# Patient Record
Sex: Male | Born: 1990 | Race: White | Hispanic: No | Marital: Single | Smoking: Current every day smoker
Health system: Southern US, Community
[De-identification: ages and names within clinical notes are randomized; demographics above are authoritative.]

## PROBLEM LIST (undated history)

## (undated) DIAGNOSIS — T7840XA Allergy, unspecified, initial encounter: Secondary | ICD-10-CM

## (undated) HISTORY — DX: Allergy, unspecified, initial encounter: T78.40XA

---
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
## 2010-05-02 ENCOUNTER — Emergency Department (HOSPITAL_COMMUNITY): Admission: EM | Admit: 2010-05-02 | Discharge: 2010-05-02 | Payer: Self-pay | Admitting: Emergency Medicine

## 2015-02-11 ENCOUNTER — Ambulatory Visit (INDEPENDENT_AMBULATORY_CARE_PROVIDER_SITE_OTHER): Payer: Self-pay | Admitting: Family Medicine

## 2015-02-11 VITALS — BP 122/42 | HR 57 | Temp 97.6°F | Resp 17 | Ht 74.5 in | Wt 272.2 lb

## 2015-02-11 DIAGNOSIS — R590 Localized enlarged lymph nodes: Secondary | ICD-10-CM

## 2015-02-11 DIAGNOSIS — K1120 Sialoadenitis, unspecified: Secondary | ICD-10-CM

## 2015-02-11 MED ORDER — CLINDAMYCIN HCL 300 MG PO CAPS: 300.0000 mg | ORAL_CAPSULE | Freq: Three times a day (TID) | ORAL | Status: DC

## 2015-02-11 MED ORDER — CLINDAMYCIN HCL 150 MG PO CAPS: 300.0000 mg | ORAL_CAPSULE | Freq: Three times a day (TID) | ORAL | Status: AC

## 2015-02-11 NOTE — Progress Notes (Signed)
Subjective: 24 year old who has not been here for a long time. He has had a 5 day history of swelling on the right side of his neck. He has a little infected pustule in his beard on the right cheek. He has no fever. It came up and swelled down his neck further. He took a couple of old amoxicillin and it is gone down some but he was concerned about the amount of swelling and discomfort in the side of his neck.  Objective: His TMs are normal. Throat clear. No palpable tenderness over the duct in the right cheek. His neck has enlargement of the right parotid versus a large node at the same area. A couple small nodes below that. His chest is clear. He has a little staph abscess in his beard which is drained.  Assessment: Parotitis versus lymphadenitis  Plan: Clindamycin 300 mg 3 times a day Return if worse

## 2015-02-11 NOTE — Patient Instructions (Signed)
Drink plenty of fluid  Suck on slivers of lemon several times daily and see if it helps the gland to go down.  Take clindamycin 3 times daily  Return if worse

## 2015-02-11 NOTE — Addendum Note (Signed)
Addended byLevon Hedger: CLINE, REBEKAH A on: 02/11/2015 06:40 PM   Modules accepted: Orders, Medications

## 2015-02-27 ENCOUNTER — Telehealth: Payer: Self-pay

## 2015-02-27 NOTE — Telephone Encounter (Signed)
Spoke with pt, he states he noticed his lymph nodes not getting better and feels he needs a refill on his Cleocin. He doesn't feel bad but is scared of his lymph nodes swelling up again and feels if he has one more round of ABX he will be ok. I advised pt to RTC, he states he does not have any insurance and does not have any money for an OV. Please advise.

## 2015-02-27 NOTE — Telephone Encounter (Signed)
Pt was seen for staph infection on face, was treated with clindamycin (CLEOCIN) 150 MG capsule   Requesting 60 count 150 MG  [facility] [address]   [phone]

## 2015-03-01 NOTE — Telephone Encounter (Signed)
Patient calling to check the [facility] [address] Per patient the area is getting worst and he is concerned. Patient requesting someone to please call him back to let him know if we are going to be able to call this in cb# [phone]

## 2015-03-03 NOTE — Telephone Encounter (Signed)
Call patient: He needs to be rechecked if the node is still swelling.

## 2015-03-03 NOTE — Telephone Encounter (Signed)
lmom for pt to cb

## 2015-03-04 NOTE — Telephone Encounter (Signed)
Left message for pt to call back  °

## 2015-03-10 NOTE — Telephone Encounter (Signed)
lmom for pt to rtc if issue is continung

## 2016-06-28 ENCOUNTER — Encounter (HOSPITAL_COMMUNITY): Payer: Self-pay | Admitting: Emergency Medicine

## 2016-06-28 ENCOUNTER — Emergency Department (HOSPITAL_COMMUNITY)
Admission: EM | Admit: 2016-06-28 | Discharge: 2016-06-28 | Disposition: A | Discharge status: Home / Self Care | Payer: Self-pay | Attending: Emergency Medicine | Admitting: Emergency Medicine

## 2016-06-28 DIAGNOSIS — F17210 Nicotine dependence, cigarettes, uncomplicated: Secondary | ICD-10-CM | POA: Insufficient documentation

## 2016-06-28 DIAGNOSIS — W540XXA Bitten by dog, initial encounter: Secondary | ICD-10-CM | POA: Insufficient documentation

## 2016-06-28 DIAGNOSIS — Y939 Activity, unspecified: Secondary | ICD-10-CM | POA: Insufficient documentation

## 2016-06-28 DIAGNOSIS — Y999 Unspecified external cause status: Secondary | ICD-10-CM | POA: Insufficient documentation

## 2016-06-28 DIAGNOSIS — Z79899 Other long term (current) drug therapy: Secondary | ICD-10-CM | POA: Insufficient documentation

## 2016-06-28 DIAGNOSIS — Y929 Unspecified place or not applicable: Secondary | ICD-10-CM | POA: Insufficient documentation

## 2016-06-28 DIAGNOSIS — S41151A Open bite of right upper arm, initial encounter: Secondary | ICD-10-CM

## 2016-06-28 DIAGNOSIS — S61551A Open bite of right wrist, initial encounter: Secondary | ICD-10-CM | POA: Insufficient documentation

## 2016-06-28 DIAGNOSIS — S50811A Abrasion of right forearm, initial encounter: Secondary | ICD-10-CM | POA: Insufficient documentation

## 2016-06-28 MED ORDER — AMOXICILLIN-POT CLAVULANATE 875-125 MG PO TABS: 1.0000 | ORAL_TABLET | Freq: Two times a day (BID) | ORAL | Status: DC

## 2016-06-28 NOTE — Discharge Instructions (Signed)

## 2016-06-28 NOTE — ED Provider Notes (Signed)
CSN: [id number]     Arrival date & time 06/28/16  1233 History   First MD Initiated Contact with Patient 06/28/16 1620     Chief Complaint  Patient presents with  . Animal Bite     (Consider location/radiation/quality/duration/timing/severity/associated sxs/prior Treatment) HPI   Sit 25 year old male who presents emergency Department with chief complaint of dog bite to the right wrist. This occurred just prior to arrival in the ED. the patient is up-to-date on his last tetanus vaccination. He cleaned the wounds thoroughly after the bite. He denies numbness or tingling in his fingers, he denies weakness or inability to move the wrist, hand or fingers. The dog is his own dog and is up-to-date on its vaccinations  Past Medical History  Diagnosis Date  . Allergy    History reviewed. No pertinent past surgical history. Family History  Problem Relation Age of Onset  . Stroke Mother   . Diabetes Maternal Grandmother   . Diabetes Maternal Grandfather   . Diabetes Paternal Grandmother    Social History  Substance Use Topics  . Smoking status: Current Every Day Smoker -- 0.50 packs/day    Types: Cigarettes  . Smokeless tobacco: None  . Alcohol Use: No    Review of Systems  Ten systems reviewed and are negative for acute change, except as noted in the HPI.    Allergies  Sulfa antibiotics  Home Medications   Prior to Admission medications   Medication Sig Start Date End Date Taking? Authorizing Provider  amoxicillin-clavulanate (AUGMENTIN) 875-125 MG tablet Take 1 tablet by mouth 2 (two) times daily. One po bid x 7 days 06/28/16   Arthor Captain, PA-C  clindamycin (CLEOCIN) 150 MG capsule Take 2 capsules (300 mg total) by mouth 3 (three) times daily. 02/11/15   Peyton Najjar, MD   BP 132/69 mmHg  Pulse 64  Temp(Src) 99.5 F (37.5 C) (Oral)  Resp 18  Ht  (1.854 m)  Wt 102.059 kg  BMI 29.69 kg/m2  SpO2 100% Physical Exam  Constitutional: He appears well-developed and  well-nourished. No distress.  HENT:  Head: Normocephalic and atraumatic.  Eyes: Conjunctivae are normal. No scleral icterus.  Neck: Normal range of motion. Neck supple.  Cardiovascular: Normal rate, regular rhythm and normal heart sounds.   Pulmonary/Chest: Effort normal and breath sounds normal. No respiratory distress.  Abdominal: Soft. There is no tenderness.  Musculoskeletal: He exhibits no edema.  Multiple small abrasions to the right forearm, 1 cm laceration over the palmar wrist surface, bleeding well controlled, 1/2 cm laceration over the ulnar wrist surface. Full range of motion of the wrist and fingers, strong grip strength, pulses intact, no sign of infection or tendon injury  Neurological: He is alert.  Skin: Skin is warm and dry. He is not diaphoretic.  Psychiatric: His behavior is normal.  Nursing note and vitals reviewed.   ED Course  Procedures (including critical care time) Labs Review Labs Reviewed - No data to display  Imaging Review No results found. I have personally reviewed and evaluated these images and lab results as part of my medical decision-making.   EKG Interpretation None      MDM   Final diagnoses:  Dog bite of arm, right, initial encounter    Patient with Dr. bite to the wrist, up-to-date on his tetanus vaccination, wounds cleansed and dressed in the emergency department. Patient is to start on Augmentin. Follow-up with hand, discussed return precautions to the ED. Pierce safe for discharge at this  time    Arthor Captainbigail Cranford Blessinger, PA-C 06/29/16 0152  Benjiman CoreNathan Pickering, MD 06/30/16 [phone]

## 2016-06-28 NOTE — ED Notes (Signed)
Declined W/C at D/C and was escorted to lobby by RN. 

## 2016-06-28 NOTE — ED Notes (Signed)
Pt here with a dog bite to the right wrist , bleeding controlled  Dogs  Shots are up to date

## 2016-08-20 ENCOUNTER — Emergency Department (HOSPITAL_COMMUNITY)
Admission: EM | Admit: 2016-08-20 | Discharge: 2016-08-21 | Disposition: A | Discharge status: Home / Self Care | Payer: Self-pay | Attending: Emergency Medicine | Admitting: Emergency Medicine

## 2016-08-20 ENCOUNTER — Encounter (HOSPITAL_COMMUNITY): Payer: Self-pay | Admitting: Emergency Medicine

## 2016-08-20 DIAGNOSIS — F17210 Nicotine dependence, cigarettes, uncomplicated: Secondary | ICD-10-CM | POA: Insufficient documentation

## 2016-08-20 DIAGNOSIS — Y999 Unspecified external cause status: Secondary | ICD-10-CM | POA: Insufficient documentation

## 2016-08-20 DIAGNOSIS — W540XXA Bitten by dog, initial encounter: Secondary | ICD-10-CM | POA: Insufficient documentation

## 2016-08-20 DIAGNOSIS — Y929 Unspecified place or not applicable: Secondary | ICD-10-CM | POA: Insufficient documentation

## 2016-08-20 DIAGNOSIS — Y939 Activity, unspecified: Secondary | ICD-10-CM | POA: Insufficient documentation

## 2016-08-20 DIAGNOSIS — S51851A Open bite of right forearm, initial encounter: Secondary | ICD-10-CM | POA: Insufficient documentation

## 2016-08-21 MED ORDER — AMOXICILLIN-POT CLAVULANATE 875-125 MG PO TABS: 1.0000 | ORAL_TABLET | Freq: Two times a day (BID) | ORAL | 0 refills | Status: AC

## 2016-08-21 NOTE — Discharge Instructions (Signed)
It was my pleasure taking care of you today!  Please take all of your antibiotics until finished! Keep the area clean and dry. Monitor for signs of infection including fever, redness, worsening swelling or drainage from the site. Return to ER for these signs.

## 2016-08-21 NOTE — ED Provider Notes (Signed)
[facility]-EMERGENCY DEPT Provider Note   CSN: [id number] Arrival date & time: 08/20/16  2349     History   Chief Complaint Chief Complaint  Patient presents with  . Animal Bite    HPI Joe Bradshaw is a 25 y.o. male.  The history is provided by the patient and medical records. No language interpreter was used.  Animal Bite  Associated symptoms: no numbness    Joe Bradshaw is a 25 y.o. male  who presents to the Emergency Department complaining of dog bite to the right forearm that occurred tonight just prior to arrival. Dog was his own pet and he was breaking up a fight between he and his brothers dog. Both dogs involved are fully vaccinated. Patient cleaned wound with rubbing alcohol prior to arrival. No medications taken for pain. No other associated symptoms.    Past Medical History:  Diagnosis Date  . Allergy     There are no active problems to display for this patient.   History reviewed. No pertinent surgical history.     Home Medications    Prior to Admission medications   Medication Sig Start Date End Date Taking? Authorizing Provider  amoxicillin-clavulanate (AUGMENTIN) 875-125 MG tablet Take 1 tablet by mouth 2 (two) times daily. One po bid x 7 days 06/28/16   Arthor Captain, PA-C  clindamycin (CLEOCIN) 150 MG capsule Take 2 capsules (300 mg total) by mouth 3 (three) times daily. 02/11/15   Peyton Najjar, MD    Family History Family History  Problem Relation Age of Onset  . Stroke Mother   . Diabetes Maternal Grandmother   . Diabetes Maternal Grandfather   . Diabetes Paternal Grandmother     Social History Social History  Substance Use Topics  . Smoking status: Current Every Day Smoker    Packs/day: 0.50    Types: Cigarettes  . Smokeless tobacco: Never Used  . Alcohol use No     Allergies   Sulfa antibiotics   Review of Systems Review of Systems  Skin: Positive for wound.  Neurological: Negative for numbness.     Physical  Exam Updated Vital Signs BP 118/95 (BP Location: Left Arm)   Pulse 72   Temp 99 F (37.2 C) (Oral)   Resp 18   Ht 6' 1.5" (1.867 m)   Wt 96.6 kg   SpO2 99%   BMI 27.72 kg/m   Physical Exam  Constitutional: He is oriented to person, place, and time. He appears well-developed and well-nourished. No distress.  HENT:  Head: Normocephalic and atraumatic.  Cardiovascular: Normal rate, regular rhythm, normal heart sounds and intact distal pulses.   Pulmonary/Chest: Effort normal and breath sounds normal. No respiratory distress.  Abdominal: Soft. He exhibits no distension. There is no tenderness.  Musculoskeletal:  RUE with full ROM.   Neurological: He is alert and oriented to person, place, and time.  RUE is neurovascularly intact.   Skin: Skin is warm and dry.  See images below.  Two superficial wounds on dorsal aspect of forearm. Palmar aspect with wound approx. 0.5 cm in depth.   Nursing note and vitals reviewed.        ED Treatments / Results  Labs (all labs ordered are listed, but only abnormal results are displayed) Labs Reviewed - No data to display  EKG  EKG Interpretation None       Radiology No results found.  Procedures Procedures (including critical care time)  Medications Ordered in ED Medications - No data  to display   Initial Impression / Assessment and Plan / ED Course  I have reviewed the triage vital signs and the nursing notes.  Pertinent labs & imaging results that were available during my care of the patient were reviewed by me and considered in my medical decision making (see chart for details).  Clinical Course   Joe Bradshaw is a 25 y.o. male who presents to ED for dog bite on right forearm that occurred just prior to arrival. Dog is his own pet and immunizations are up-to-date. Wound thoroughly cleaned in ED. Home care instructions discussed. Return precautions discussed. Rx for Augmentin given. All questions  answered.  Final Clinical Impressions(s) / ED Diagnoses   Final diagnoses:  None    New Prescriptions New Prescriptions   No medications on file     [facility] Surgery[facility] Pilcher Joe Sayegh, PA-C 08/21/16 0028    Blane OharaJoshua Zavitz, MD 08/27/16 [phone]

## 2016-08-21 NOTE — ED Triage Notes (Signed)
Pt. bitten by his dog this evening presents with multiple puncture wounds with minimal bleeding at right distal forearm . Dog's immunizations are complete.

## 2016-08-21 NOTE — ED Notes (Signed)
PA at bedside.

## 2017-02-04 ENCOUNTER — Emergency Department (HOSPITAL_COMMUNITY)
Admission: EM | Admit: 2017-02-04 | Discharge: 2017-02-04 | Disposition: A | Discharge status: Home / Self Care | Payer: No Typology Code available for payment source | Attending: Emergency Medicine | Admitting: Emergency Medicine

## 2017-02-04 ENCOUNTER — Emergency Department (HOSPITAL_COMMUNITY): Payer: No Typology Code available for payment source

## 2017-02-04 ENCOUNTER — Encounter (HOSPITAL_COMMUNITY): Payer: Self-pay

## 2017-02-04 DIAGNOSIS — S50812A Abrasion of left forearm, initial encounter: Secondary | ICD-10-CM | POA: Insufficient documentation

## 2017-02-04 DIAGNOSIS — Y9389 Activity, other specified: Secondary | ICD-10-CM | POA: Insufficient documentation

## 2017-02-04 DIAGNOSIS — S161XXA Strain of muscle, fascia and tendon at neck level, initial encounter: Secondary | ICD-10-CM | POA: Insufficient documentation

## 2017-02-04 DIAGNOSIS — R51 Headache: Secondary | ICD-10-CM | POA: Diagnosis not present

## 2017-02-04 DIAGNOSIS — F17210 Nicotine dependence, cigarettes, uncomplicated: Secondary | ICD-10-CM | POA: Insufficient documentation

## 2017-02-04 DIAGNOSIS — Y92410 Unspecified street and highway as the place of occurrence of the external cause: Secondary | ICD-10-CM | POA: Insufficient documentation

## 2017-02-04 DIAGNOSIS — S199XXA Unspecified injury of neck, initial encounter: Secondary | ICD-10-CM | POA: Diagnosis present

## 2017-02-04 DIAGNOSIS — Y999 Unspecified external cause status: Secondary | ICD-10-CM | POA: Insufficient documentation

## 2017-02-04 MED ORDER — METHOCARBAMOL 500 MG PO TABS: 500.0000 mg | ORAL_TABLET | Freq: Three times a day (TID) | ORAL | 0 refills | Status: AC | PRN
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
## 2017-02-04 NOTE — ED Notes (Signed)
ED Provider at bedside. 

## 2017-02-04 NOTE — ED Notes (Signed)
Pt reports he felt like his neck "popped" while laying down and that he saw "stars". Pt alert and oriented upon assessment, no obvious deformity of neck visualized. MD made aware.

## 2017-02-04 NOTE — ED Notes (Signed)
Attempted to CT to complete exam. Unable to obtain CT tech.
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
